# Patient Record
Sex: Male | Born: 1995 | Hispanic: Yes | Marital: Single | Smoking: Never smoker
Health system: Southern US, Community
[De-identification: ages and names within clinical notes are randomized; demographics above are authoritative.]

---

## 2019-07-31 ENCOUNTER — Emergency Department (HOSPITAL_BASED_OUTPATIENT_CLINIC_OR_DEPARTMENT_OTHER)
Admission: EM | Admit: 2019-07-31 | Discharge: 2019-07-31 | Disposition: A | Discharge status: Home / Self Care | Payer: No Typology Code available for payment source | Attending: Emergency Medicine | Admitting: Emergency Medicine

## 2019-07-31 ENCOUNTER — Other Ambulatory Visit: Payer: Self-pay

## 2019-07-31 ENCOUNTER — Encounter (HOSPITAL_BASED_OUTPATIENT_CLINIC_OR_DEPARTMENT_OTHER): Payer: Self-pay | Admitting: *Deleted

## 2019-07-31 ENCOUNTER — Emergency Department (HOSPITAL_BASED_OUTPATIENT_CLINIC_OR_DEPARTMENT_OTHER): Payer: No Typology Code available for payment source

## 2019-07-31 DIAGNOSIS — G44209 Tension-type headache, unspecified, not intractable: Secondary | ICD-10-CM | POA: Diagnosis not present

## 2019-07-31 DIAGNOSIS — Y92410 Unspecified street and highway as the place of occurrence of the external cause: Secondary | ICD-10-CM | POA: Insufficient documentation

## 2019-07-31 DIAGNOSIS — Y999 Unspecified external cause status: Secondary | ICD-10-CM | POA: Insufficient documentation

## 2019-07-31 DIAGNOSIS — S199XXA Unspecified injury of neck, initial encounter: Secondary | ICD-10-CM | POA: Diagnosis present

## 2019-07-31 DIAGNOSIS — S161XXA Strain of muscle, fascia and tendon at neck level, initial encounter: Secondary | ICD-10-CM | POA: Insufficient documentation

## 2019-07-31 DIAGNOSIS — Y9389 Activity, other specified: Secondary | ICD-10-CM | POA: Insufficient documentation

## 2019-07-31 MED ORDER — ACETAMINOPHEN 500 MG PO TABS
1000.0000 mg | ORAL_TABLET | Freq: Once | ORAL | Status: AC
  Administered 2019-07-31: 1000 mg via ORAL
  Filled 2019-07-31: qty 2

## 2019-07-31 MED ORDER — IBUPROFEN 400 MG PO TABS
600.0000 mg | ORAL_TABLET | Freq: Once | ORAL | Status: AC
  Administered 2019-07-31: 600 mg via ORAL
  Filled 2019-07-31: qty 1

## 2019-07-31 NOTE — Discharge Instructions (Signed)
Ibuprofen 600mg  (3 pills) every 8 hours  Tylenol 1,000mg  (2 extra strength pills every 8 hours)
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
